# Patient Record
Sex: Female | Born: 1960 | Race: White | Hispanic: No | Marital: Married | Smoking: Never smoker
Health system: Southern US, Community
[De-identification: ages and names within clinical notes are randomized; demographics above are authoritative.]

## PROBLEM LIST (undated history)

## (undated) DIAGNOSIS — IMO0002 Reserved for concepts with insufficient information to code with codable children: Secondary | ICD-10-CM

## (undated) DIAGNOSIS — B977 Papillomavirus as the cause of diseases classified elsewhere: Secondary | ICD-10-CM

## (undated) HISTORY — DX: Reserved for concepts with insufficient information to code with codable children: IMO0002

## (undated) HISTORY — PX: WISDOM TOOTH EXTRACTION: SHX21

## (undated) HISTORY — DX: Papillomavirus as the cause of diseases classified elsewhere: B97.7

---

## 2003-11-22 ENCOUNTER — Other Ambulatory Visit: Admission: RE | Admit: 2003-11-22 | Discharge: 2003-11-22 | Payer: Self-pay | Admitting: *Deleted

## 2005-01-21 ENCOUNTER — Other Ambulatory Visit: Admission: RE | Admit: 2005-01-21 | Discharge: 2005-01-21 | Payer: Self-pay | Admitting: *Deleted

## 2005-10-12 ENCOUNTER — Emergency Department (HOSPITAL_COMMUNITY): Admission: EM | Admit: 2005-10-12 | Discharge: 2005-10-12 | Payer: Self-pay | Admitting: Emergency Medicine

## 2006-01-27 ENCOUNTER — Other Ambulatory Visit: Admission: RE | Admit: 2006-01-27 | Discharge: 2006-01-27 | Payer: Self-pay | Admitting: Obstetrics & Gynecology

## 2006-06-24 ENCOUNTER — Ambulatory Visit: Payer: Self-pay | Admitting: Psychiatry

## 2006-06-24 ENCOUNTER — Inpatient Hospital Stay (HOSPITAL_COMMUNITY): Admission: RE | Admit: 2006-06-24 | Discharge: 2006-06-26 | Payer: Self-pay | Admitting: Psychiatry

## 2006-06-24 ENCOUNTER — Emergency Department (HOSPITAL_COMMUNITY): Admission: EM | Admit: 2006-06-24 | Discharge: 2006-06-24 | Payer: Self-pay | Admitting: Emergency Medicine

## 2007-02-02 ENCOUNTER — Other Ambulatory Visit: Admission: RE | Admit: 2007-02-02 | Discharge: 2007-02-02 | Payer: Self-pay | Admitting: Obstetrics & Gynecology

## 2007-11-21 ENCOUNTER — Encounter: Admission: RE | Admit: 2007-11-21 | Discharge: 2007-11-21 | Payer: Self-pay | Admitting: Orthopedic Surgery

## 2009-10-11 IMAGING — CR DG CERVICAL SPINE 2 OR 3 VIEWS
2 series · 2 of 2 positions shown · non-contrast
Comparison: None.

CLINICAL DATA: Cervical pain and shoulder pain. 
 CERVICAL SPINE ? 2 VIEW:

[view not recorded (1 of 2)]
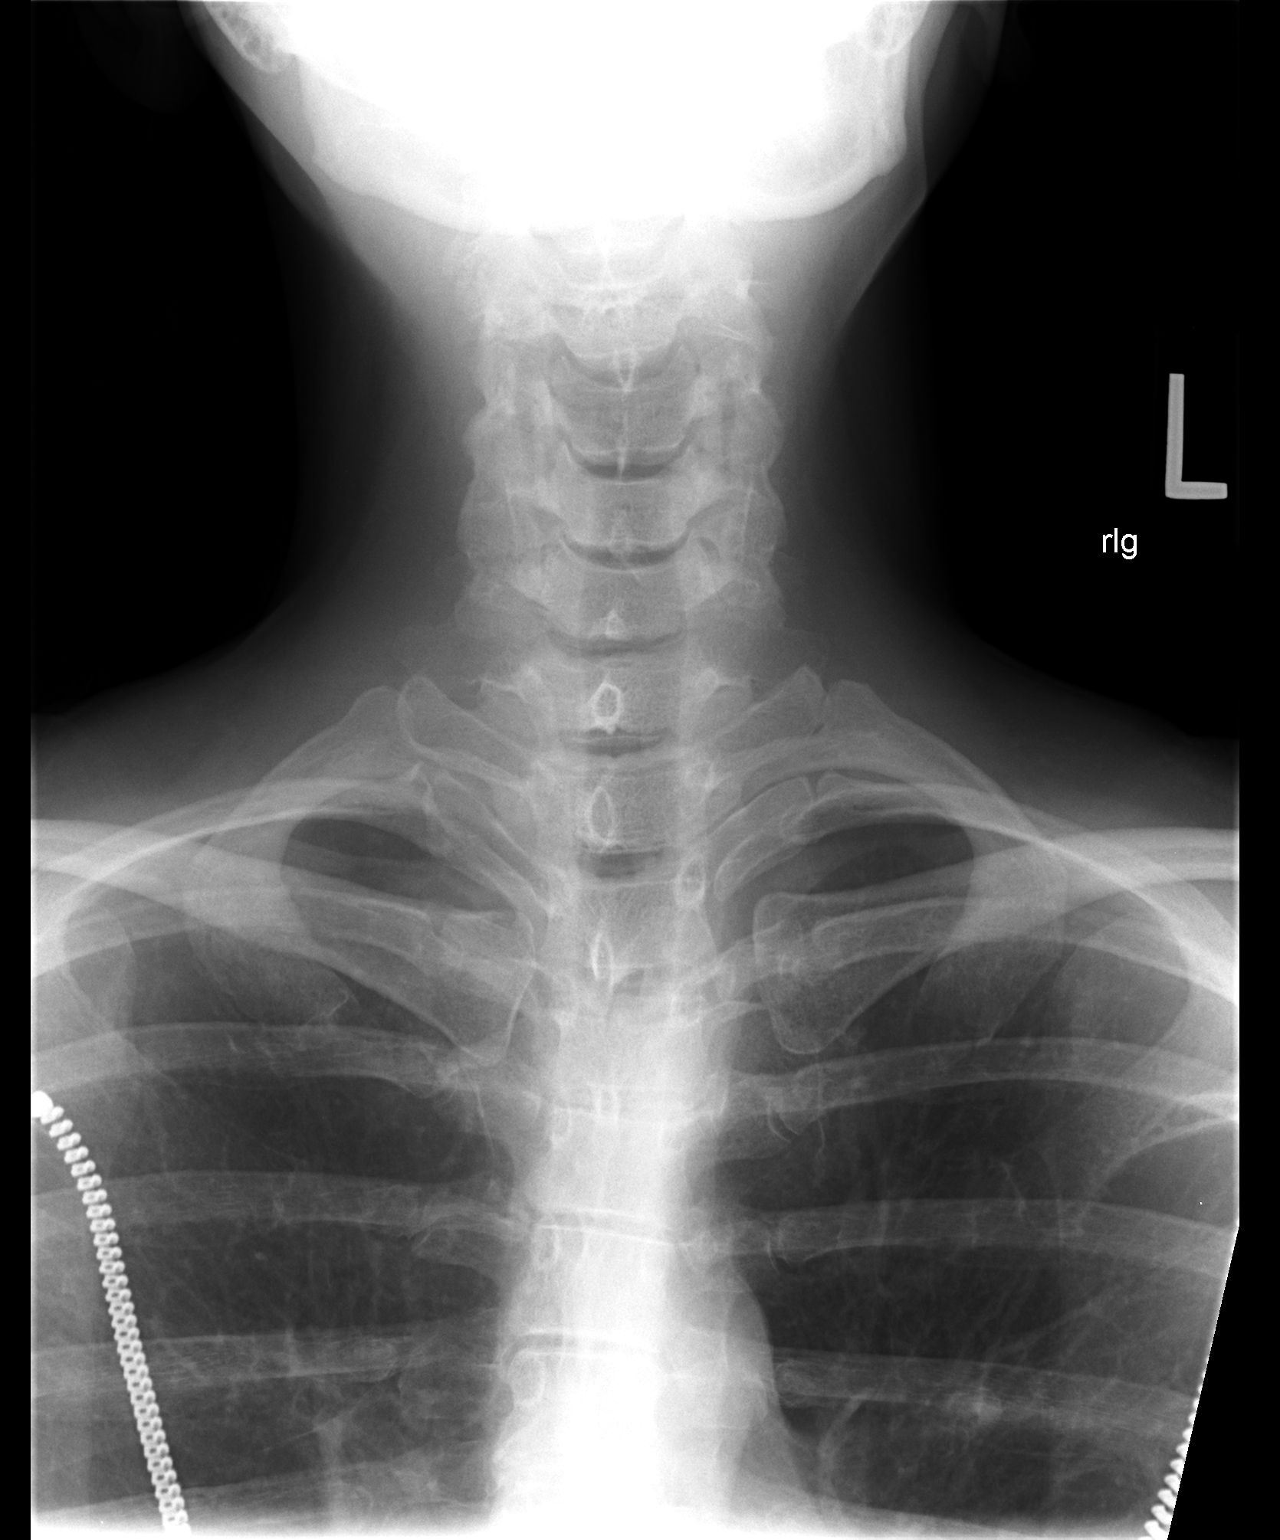

[view not recorded (2 of 2)]
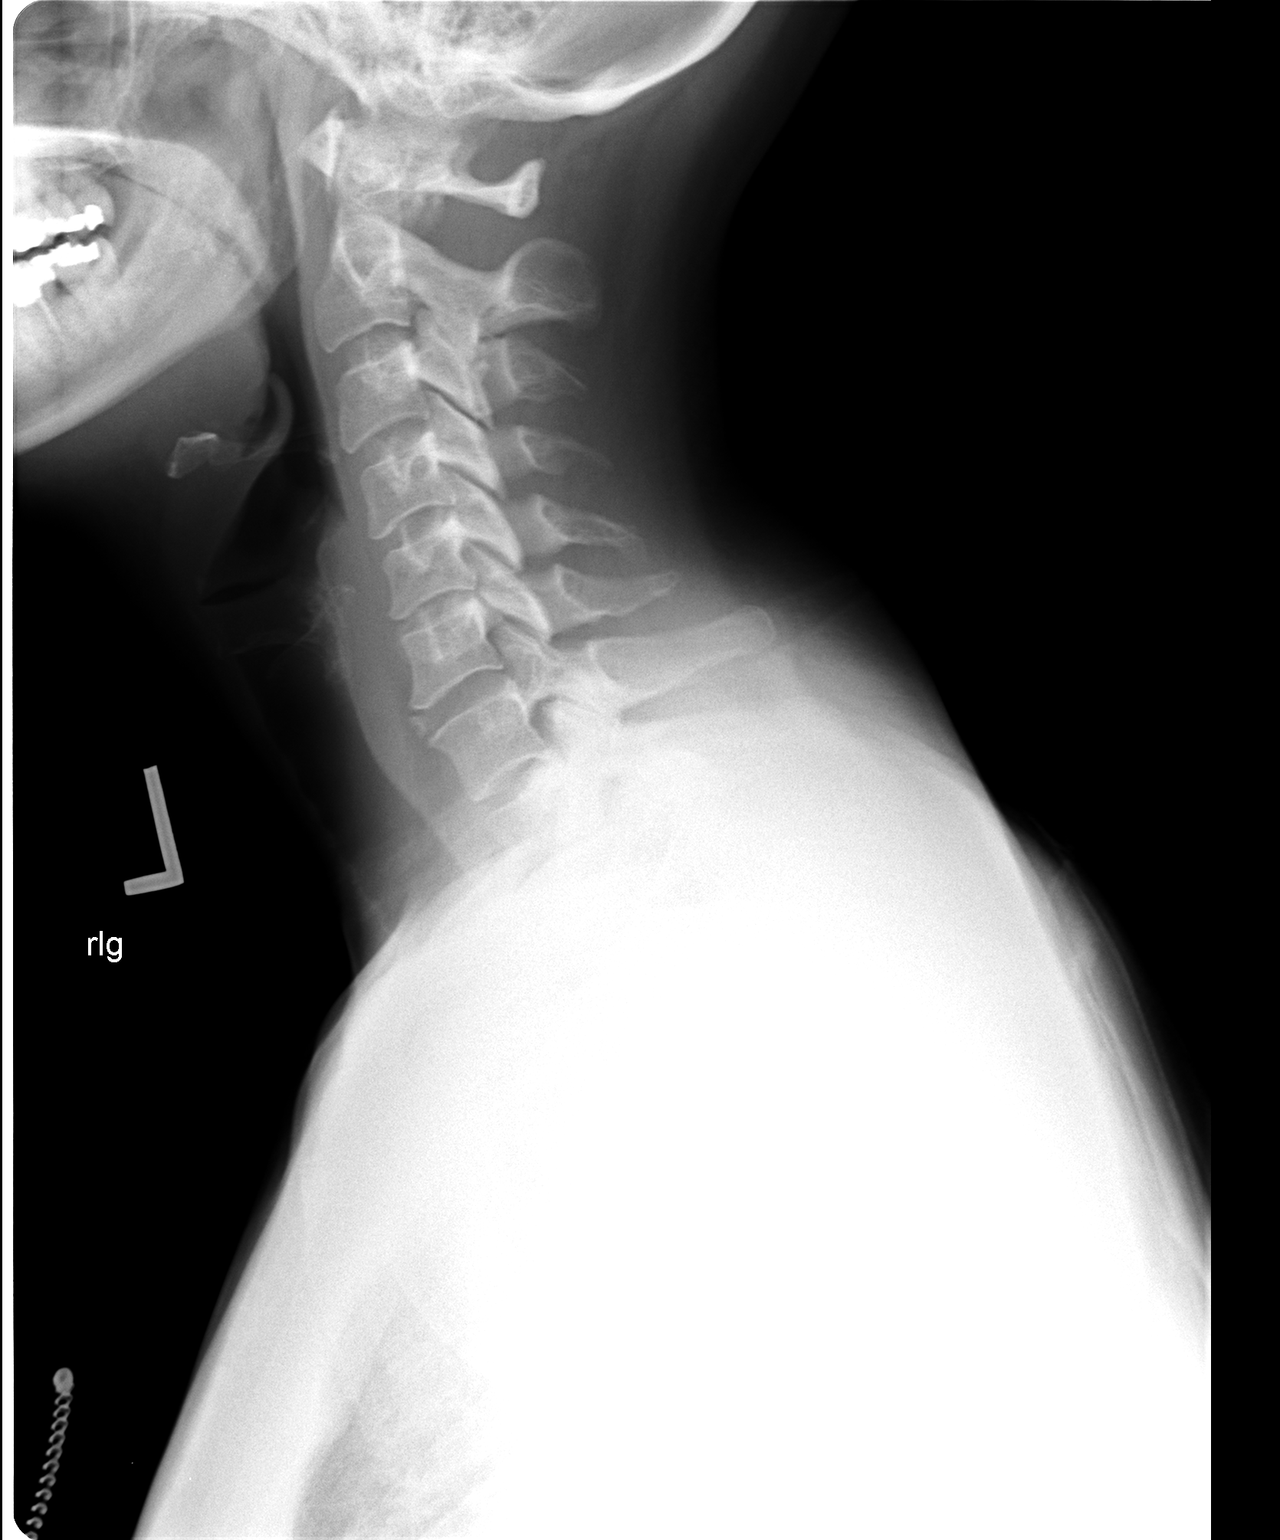

[2 of 2 positions shown; findings below may reference images not displayed]

FINDINGS: Cervical spine is visualized from the occiput to the C7-T1 junction.  Alignment is anatomic and prevertebral soft tissues are within normal limits.  Mild uncovertebral hypertrophy.  Anterior osteophyte at C5-6.  There may be minimal loss of disc space height at C5-6.
IMPRESSION: Mild spondylosis.

## 2011-01-23 NOTE — Discharge Summary (Signed)
Andrea Santos, Andrea Santos            ACCOUNT NO.:  [id number]   MEDICAL RECORD NO.:  [id number]          PATIENT TYPE:  IPS   LOCATION:  0303                          FACILITY:  [facility]   PHYSICIAN:  Syed T. Arfeen, M.D.   DATE OF BIRTH:  [birth year]   DATE OF ADMISSION:  06/25/2006  DATE OF DISCHARGE:  06/26/2006                               DISCHARGE SUMMARY   IDENTIFYING INFORMATION:  The patient is a 50 year old married female  who was admitted for depression and suicidal thoughts.  The patient  reported that she has been feeling down and sad for past six months.  She reported that she is overwhelmed and endorses marital conflict.  She  said that she just wanted to sleep with a bunch of Ambien.  She  reported that she is concerned about her husband, that he has been  spending a lot of money and has not come up with justified explanation.  She also reported that she is concerned if her husband has been using  drugs or involved in adultery.  The patient reported that she has lost  interest in daily activities and also endorsed sporadic sleep  disturbance.  She reported some poor appetite but denies any auditory  hallucinations, homicidal ideation or paranoid thoughts.  She said that  she wanted to get some therapy so that she can go back to live as a  normal person.  Please see admission note for more details.   PAST PSYCHIATRIC HISTORY:  The patient reported that she has been seeing  Dr. Wynonia Lawman a few years ago and given Ambien, which now maintained by  OB/GYN and she takes the Ambien whenever she goes into cycle, when she  cannot sleep.  The patient denies taking any other psychotropic  medication in the past.  No past psychiatric history of suicidal  attempt.   FAMILY HISTORY:  The patient told that her mother has a history of  depression and father has a history of alcohol abuse.   ALCOHOL/DRUG HISTORY:  The patient reported that sometimes she does  drink a bottle of wine and has  used marijuana in the past.  She endorsed  the last use of marijuana was a couple of days ago.   MEDICAL HISTORY:  The patient sees Dr. Clelia Croft in [place].  She denies  any history of heart disease, diabetes mellitus, seizure disorder or  bleeding disorder though she does report a migraine which she related to  her menstrual cycle.   MEDICATIONS:  She said that she takes __________ cream and Ambien 10 mg  as needed for sleep.   ALLERGIES:  No known drug allergies.   MENTAL STATUS EXAM:  The patient appears to be alert and oriented x3.  She is somewhat tearful but maintains good eye contact.  Cooperative and  pleasant on approach.  Her speech was somewhat normal rate, rhythm and  tone.  Her mood appears to be depressed and tearful at times and affect  was constricted.  She endorses some passive suicidal ideation that she  wanted to go to sleep for a long time.  She denies auditory  hallucinations, homicidal ideation or active suicidal thoughts.  Her  thought process is logical, linear and goal directed.  No paranoia,  obsession or delusion noted.  Her attention and concentration appears to  be fair.  Her cognition seems to be intact.  Insight, judgment and  impulse control good.   ADMISSION DIAGNOSES:  AXIS I:  Depressive disorder not otherwise  specified.  Rule out major depressive disorder, severe.  Rule out  alcohol/cannabis abuse versus dependence.  AXIS II:  Deferred.  AXIS III:  Migraines.  AXIS IV:  Marital conflict, problems with the primary support group.  AXIS V:  35-40.   HOSPITAL COURSE:  The patient was admitted and placed on a safety check.  The patient was recommended to be involved in group therapy.  She  refused to take any antidepressant and reported that she would like to  be discharged and she owed a 72-hour notice as she felt that she would  get more depressed if she stayed in the hospital.  Librium 25 mg was  offered as a p.r.n. for her anxiety though  patient continued to endorse  that she does not need any medication.  She stated that she would like  to get individual therapy and very reluctant to talk about her problems  in the group.  A family session with the husband was scheduled in which  husband did mention that she did not have any suicidal ideation or  homicidal ideation.  The patient also endorses that currently she has no  thoughts of hurting herself or anyone else and she wanted to get better  and that was a mistake what she did.  Husband was also a little  concerned that patient was not getting good care at the hospital and he  requested that the patient be discharged.  He endorses that outpatient  therapy would be more helpful for the patient.  On June 26, 2006, the  patient endorses that she is feeling much better and would like to go  back home.  She denies any suicidal thoughts, homicidal thoughts or  auditory hallucinations.  Her affect appears to be somewhat brighter.  The patient was discussed about the possible outpatient treatment  including intensive outpatient therapy.  The patient reported that she  would like to be followed up with her insurance network.  She feels that  she is ready to be discharged and can get help in individual as an  outpatient.  No psychiatric medication was started or given.   At the time of discharge, mental status appeared calm, cooperative,  pleasant, denies any suicidal thoughts, homicidal thoughts or auditory  hallucinations.  Affect bright with increased coping and social skills.   DISCHARGE MEDICATIONS:  None.   DISPOSITION:  The patient said that she wanted to get therapist and  psychiatric help in network.  The patient was recommended to call  casemanager for possible referrals which patient acknowledged and  agreed.   DISCHARGE DIAGNOSES:  AXIS I:  Depressive disorder not otherwise  specified.  AXIS II:  Deferred.  AXIS III:  Migraine. AXIS IV:  Marital conflicts.   AXIS V:  55-60.      Syed T. Lolly Mustache, M.D.  Electronically Signed     STA/MEDQ  D:  08/06/2006  T:  08/06/2006  Job:  [id number]

## 2012-04-08 DIAGNOSIS — R87619 Unspecified abnormal cytological findings in specimens from cervix uteri: Secondary | ICD-10-CM

## 2012-04-08 DIAGNOSIS — IMO0002 Reserved for concepts with insufficient information to code with codable children: Secondary | ICD-10-CM

## 2012-04-08 HISTORY — DX: Reserved for concepts with insufficient information to code with codable children: IMO0002

## 2012-04-08 HISTORY — DX: Unspecified abnormal cytological findings in specimens from cervix uteri: R87.619

## 2012-05-27 ENCOUNTER — Ambulatory Visit (INDEPENDENT_AMBULATORY_CARE_PROVIDER_SITE_OTHER): Payer: BC Managed Care – PPO | Admitting: Obstetrics and Gynecology

## 2012-05-27 ENCOUNTER — Encounter: Payer: Self-pay | Admitting: Obstetrics and Gynecology

## 2012-05-27 VITALS — BP 100/66 | Resp 16 | Ht 68.0 in | Wt 153.0 lb

## 2012-05-27 DIAGNOSIS — B977 Papillomavirus as the cause of diseases classified elsewhere: Secondary | ICD-10-CM

## 2012-05-27 DIAGNOSIS — N949 Unspecified condition associated with female genital organs and menstrual cycle: Secondary | ICD-10-CM

## 2012-05-27 HISTORY — DX: Papillomavirus as the cause of diseases classified elsewhere: B97.7

## 2012-05-27 NOTE — Progress Notes (Signed)
The patient is not currently taking hormone replacement therapy The patient  is not currently taking a Calcium supplement. Post-menopausal bleeding:yes:  Has spotting every once in a while per pt.  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  PT VERY UPSET PREVIOUS PRACTICE DISMISSED HER JUST BECAUSE SHE REQUESTED MORE INFO REGARDING HER PAP AND SHE WANTED THE PROVIDER TO COMPARE 2010 BLOOD WORK TO 2013 BLOOD WORK BUT THEY FAILED TO DO THAT FOR HER.  PT HAS RECORDS AND 10 YEARS WORTH OF ALL MEDS & SUPPLEMENTS SHE'S TAKEN IN ADDITION TO ALL OF HER PERIODS.      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 The patient was "discharged from her last practice" according to the patient because she refused to have a colposcopy until she had a better understanding of her condition with HPV 16. She had been very vigilant is her self care and had 10 years of records to bring to this visit O: All records  Reviewed.      2010 Pap : normal - no HPV      Aug 2013: HPV identified with no dysplasia on the Pap. Had been advised to have Colposcopy for mapping of Endocervical cells for dysplasia. The patient       Had minimal understanding of her condition and the procedure "which was not explained" according to the patient .      All lab results reviewed with the patient and the Genotyping of the HPV was explained and the etiology of HPV was explained and the nature of HPV 16/18.      The patient stated understanding that HPV 16/18 were a pre disposal to Carcinoma. She was relieved to understand the nature of her condition and the she       Now understands the procedure of Colposcopy. A: Patient has  diagnosis of HPV 16. The patient will need a  Colposcopy with Dr SR and f/u possibly with EP. P: Coloscopy to be organized for Ms Hitchcook with Dr  Lois Huxley schedule.  To call patient with appointment.     The patient was satisfied with her visit today. Earl Gala, CNM.

## 2012-06-06 ENCOUNTER — Telehealth: Payer: Self-pay | Admitting: Obstetrics and Gynecology

## 2012-06-08 NOTE — Telephone Encounter (Signed)
colpo sch for pt 07-01-12 per DD. Pt agreeable.  ld

## 2012-07-01 ENCOUNTER — Ambulatory Visit (INDEPENDENT_AMBULATORY_CARE_PROVIDER_SITE_OTHER): Payer: BC Managed Care – PPO | Admitting: Obstetrics and Gynecology

## 2012-07-01 ENCOUNTER — Encounter: Payer: Self-pay | Admitting: Obstetrics and Gynecology

## 2012-07-01 VITALS — BP 110/58 | Wt 155.0 lb

## 2012-07-01 DIAGNOSIS — Z0189 Encounter for other specified special examinations: Secondary | ICD-10-CM

## 2012-07-01 DIAGNOSIS — B977 Papillomavirus as the cause of diseases classified elsewhere: Secondary | ICD-10-CM

## 2012-07-01 DIAGNOSIS — R6889 Other general symptoms and signs: Secondary | ICD-10-CM

## 2012-07-01 DIAGNOSIS — IMO0002 Reserved for concepts with insufficient information to code with codable children: Secondary | ICD-10-CM

## 2012-07-01 LAB — POCT URINE PREGNANCY: Preg Test, Ur: NEGATIVE

## 2012-07-01 MED ORDER — FOLIC ACID 1 MG PO TABS: 1.0000 mg | ORAL_TABLET | Freq: Every day | ORAL | Status: AC

## 2012-07-01 NOTE — Progress Notes (Signed)
Colposcopy Procedure Note  Indications: Pap smear on August 2013 showed: Normal pap positive HPV . Previous colposcopy: pt never had one. Prior cervical treatment: no treatment.  Procedure Details  The risks and benefits of the procedure and Written informed consent obtained.  Speculum placed in vagina and excellent visualization of cervix achieved, cervix swabbed x 3 with acetic acid solution.  Findings: Cervix: no visible lesions; SCJ visualized 360 degrees without lesions. Vaginal inspection: normal without visible lesions. Vulvar colposcopy: normal mucosa without lesions.  Specimens: none  Complications: none.  Plan: Reviewed at length that Normal Pap with + HPV only requires that she maintains Pap yearly and not every 3 years. Answered all questions.  Silverio Lay MD

## 2012-10-27 ENCOUNTER — Telehealth: Payer: Self-pay | Admitting: Obstetrics and Gynecology

## 2012-10-27 NOTE — Telephone Encounter (Signed)
sr pt 

## 2012-10-27 NOTE — Telephone Encounter (Signed)
LVM for return call  Elgar Scoggins, CMA  

## 2012-10-27 NOTE — Telephone Encounter (Signed)
Pt calling at colposcopy visit she was given folvite. Pt states that she is concerned about rx not being organic. States that she is wanting to take only organic medication. Pt is wanting to know if this is something she needs to continue or if she can take a organic b or b complex. Wanting to know your opinion on this  Darien Ramus, CMA

## 2012-10-28 NOTE — Telephone Encounter (Signed)
She can take "organic" Folic Acid 1 mg daily until pap smear normalizes

## 2012-10-31 NOTE — Telephone Encounter (Signed)
Pt advised  Joseline Mccampbell, CMA  

## 2014-07-09 ENCOUNTER — Encounter: Payer: Self-pay | Admitting: Obstetrics and Gynecology

## 2017-02-15 ENCOUNTER — Ambulatory Visit (INDEPENDENT_AMBULATORY_CARE_PROVIDER_SITE_OTHER): Payer: BLUE CROSS/BLUE SHIELD | Admitting: Internal Medicine

## 2017-02-15 ENCOUNTER — Encounter: Payer: Self-pay | Admitting: Internal Medicine

## 2017-02-15 VITALS — BP 100/80 | HR 70 | Ht 68.5 in | Wt 151.0 lb

## 2017-02-15 DIAGNOSIS — R002 Palpitations: Secondary | ICD-10-CM | POA: Diagnosis not present

## 2017-02-15 NOTE — Progress Notes (Signed)
New Outpatient Visit Date: 02/15/2017  Referring Provider: Henreitta Leber, [facility] [address], [address] [place], [place] [address]  Chief Complaint: Palpitations  HPI:  Andrea Santos is a 56 y.o. female who is being seen today for the evaluation of palpitations at the request of Andrea Santos. She has no significant past medical history. Over the last 3 months, she has had multiple episodes of irregular heart beats and an increased awareness of her heart beating. She occasionally also feels a tightness in her throat. She describes the sensation in her chest like a hose that is being unkinked. She also notices occasional brief flutters. The episodes happen multiple times a day and can last anywhere from 10 minutes to several hours. The sensations seem to occur more frequently when she is hungry or tired. She has tried bringing them on by exercising without any noticeable change. She does not have any palpitations or lightheadedness with exercise. Andrea Santos reports having had palpitations about 8 years ago during a particularly stressful time in her life. She underwent exercise tolerance testing and reports that it was normal. She does not believe that she wore a heart monitor at the time. She began taking a vitamin and mineral supplement about a week ago and has noticed some improvement in the frequency and intensity of her palpitations. She has not had any chest pain, shortness of breath, lightheadedness, orthopnea, PND, and claudication. She does not consume any caffeine. She denies significant weight changes as well as changes to her hair and skin. Recent labs by Andrea Santos were unrevealing (see details below).  --------------------------------------------------------------------------------------------------  Cardiovascular History & Procedures: Cardiovascular Problems:  Palpitations  Risk Factors:  None  Cath/PCI:  None  CV Surgery:  None  EP Procedures  and Devices:  None  Non-Invasive Evaluation(s):  Patient reports exercise tolerance test about 8 years ago by an outside provider. She believes the test was negative.  Recent CV Pertinent Labs: See below.  --------------------------------------------------------------------------------------------------  Past Medical History:  Diagnosis Date  . Abnormal Pap smear 04/08/12   + hpv   . 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 CESAREAN SECTION    . WISDOM TOOTH EXTRACTION      Outpatient Encounter Prescriptions as of 02/15/2017  Medication Sig  . NON FORMULARY Takes a vitramin pack once a day that has a variety of meds  . [DISCONTINUED] zolpidem (AMBIEN) 5 MG tablet Take 5 mg by mouth at bedtime as needed.   No facility-administered encounter medications on file as of 02/15/2017.     Allergies: Patient has no known allergies.  Social History   Social History  . Marital status: Married    Spouse name: N/A  . Number of children: N/A  . Years of education: N/A   Occupational History  . Not on file.   Social History Main Topics  . Smoking status: Never Smoker  . Smokeless tobacco: Never Used  . Alcohol use 4.8 oz/week    2 Standard drinks or equivalent, 6 Glasses of wine per week  . Drug use: Yes    Frequency: 3.0 times per week    Types: Marijuana  . Sexual activity: Yes    Partners: Male    Birth control/ protection: None, Surgical     Comment: vas    Other Topics Concern  . Not on file   Social History Narrative  . No narrative on file  Family History  Problem Relation Age of Onset  . Thyroid nodules Paternal Grandmother   . Hypertension Paternal Grandmother   . Cancer Maternal Grandmother        ?  Marland Kitchen. COPD Father     Review of Systems: A 12-system review of systems was performed and was negative except as noted in the  HPI.  --------------------------------------------------------------------------------------------------  Physical Exam: BP 100/80   Pulse 70   Ht 5' 8.5" (1.74 m)   Wt 151 lb (68.5 kg)   LMP 03/10/2012   BMI 22.63 kg/m   General:  Well-developed, well-nourished woman seated comfortably in the exam room. HEENT: No conjunctival pallor or scleral icterus.  Moist mucous membranes.  OP clear. Neck: Supple without lymphadenopathy, thyromegaly, JVD, or HJR.  No carotid bruit. Lungs: Normal work of breathing.  Clear to auscultation bilaterally without wheezes or crackles. Heart: Regular rate and rhythm with rare extrasystoles. No murmurs, rubs, or gallops.  Non-displaced PMI. Abd: Bowel sounds present.  Soft, NT/ND without hepatosplenomegaly Ext: No lower extremity edema.  Radial, PT, and DP pulses are 2+ bilaterally Skin: warm and dry without rash Neuro: CNIII-XII intact.  Strength and fine-touch sensation intact in upper and lower extremities bilaterally. Psych: Normal mood and affect.  EKG:  Normal sinus rhythm with borderline left atrial enlargement. No significant abnormalities.  Outside labs (01/28/17):  Lipid panel: Total cholesterol 192, HDL 84, triglyceride 155, LDL 82  CMP: Sodium 140, potassium 4.1, chloride 109, CO2 19, BUN 11, creatinine 0.97, glucose 104, calcium 9.9, AST, 18, ALT 8, alkaline phosphatase 61, total bilirubin 0.4, total protein 7.4, albumin 4.5  CBC: WBC 6.0, HGB 14.1, HCT 41.4%, platelet 200  TSH 1.56 --------------------------------------------------------------------------------------------------  ASSESSMENT AND PLAN: Palpitations Symptoms have been present on a daily basis for the last 3 months. EKG today is unrevealing, though rare extrasystoles were noted on exam, suggestive of PACs or PVCs. There are no worrisome symptoms accompanying her palpitations. She is able to exercise without symptoms. We have agreed to obtain a 48-hour Holter monitor for  further characterization. Recent labs were unrevealing, including normal potassium and TSH. We have agreed to defer checking a magnesium level, as the patient routinely takes a magnesium supplement and does not have other electrolyte abnormalities. Patient encouraged to limit alcohol and marijuana use.  Follow-up: Return to clinic in 1 month.  Yvonne Kendallhristopher Walther San[place]ustin, MD 02/16/2017 10:00 AM

## 2017-02-15 NOTE — Patient Instructions (Signed)
Medication Instructions:  Your physician recommends that you continue on your current medications as directed. Please refer to the Current Medication list given to you today.  Labwork: None   Testing/Procedures: Your physician has recommended that you wear a holter monitor. Holter monitors are medical devices that record the heart's electrical activity. Doctors most often use these monitors to diagnose arrhythmias. Arrhythmias are problems with the speed or rhythm of the heartbeat. The monitor is a small, portable device. You can wear one while you do your normal daily activities. This is usually used to diagnose what is causing palpitations/syncope (passing out).  48 HOUR  Follow-Up: Your physician recommends that you schedule a follow-up appointment in: 1 month with Dr End.        If you need a refill on your cardiac medications before your next appointment, please call your pharmacy.

## 2017-02-16 ENCOUNTER — Encounter: Payer: Self-pay | Admitting: Internal Medicine

## 2017-03-26 ENCOUNTER — Ambulatory Visit: Payer: BLUE CROSS/BLUE SHIELD | Admitting: Internal Medicine

## 2017-04-02 ENCOUNTER — Ambulatory Visit (INDEPENDENT_AMBULATORY_CARE_PROVIDER_SITE_OTHER): Payer: BLUE CROSS/BLUE SHIELD

## 2017-04-02 DIAGNOSIS — R002 Palpitations: Secondary | ICD-10-CM | POA: Diagnosis not present

## 2017-04-05 ENCOUNTER — Telehealth: Payer: Self-pay | Admitting: Internal Medicine

## 2017-04-05 NOTE — Telephone Encounter (Signed)
Patient calling, states that she received Holter Monitor on Friday and was supposed to have it monitor for 48 hours, but patient states that monitor cut off after 24 hours. Please call, patient is not sure what she should do.

## 2017-04-08 ENCOUNTER — Telehealth: Payer: Self-pay | Admitting: *Deleted

## 2017-04-08 NOTE — Telephone Encounter (Signed)
Took call from [facility], in Triage. Per [facility], pt has called on and off all day and has tried to find out if she needed to continue to wear her heart monitor since she already wore 1 for 24 hours and it died on her.  I recommended to pt to just wear it for the complete 48 hours, that the more information we gathered, the better it was for a diagnosis.  Pt very frustrated that she has waited all day to get an answer.  She stated that they had [facility] and noone has yet called her back. I apologized to pt for the inconvenience this has caused, she stated to me, " you should be", "even though It's not your fault, and I'm sorry I'm taking it out on you, but I am very frustrated.   Pt asked me to pass this along to mgmt. She doesn't feel that she got professional medical service.  Routed message to mgmt and [facility].

## 2017-04-08 NOTE — Telephone Encounter (Signed)
Follow up     Pt is calling back to follow up on holter monitor. Please call.

## 2017-04-08 NOTE — Telephone Encounter (Signed)
F/U call:  Patient states that she has some questions about her Holter Monitor and when she can take it off. Please call.

## 2017-04-09 ENCOUNTER — Telehealth: Payer: Self-pay | Admitting: *Deleted

## 2017-04-09 DIAGNOSIS — I491 Atrial premature depolarization: Secondary | ICD-10-CM

## 2017-04-09 NOTE — Telephone Encounter (Signed)
Patient was enrolled for a 48 hour holter monitor with [employer].  [employer] had supplied the [employer] office with monitors, which, were retreived from [employer], during a holter monitor shortage, ([employer] system was hacked and shut down).  The monitor that was applied to patient stopped recording after 24 hours.  Patient had made multiple documentation of symptoms during that 24 hour period.  Patient had discussed this with me. I had patient come in to apply a 2nd monitor for 24 hours.  The first  monitor was uploaded and 24 hours of data was confirmed.  The monitor representative let me know the monitor we were given was a new monitor with the old wires, which, would only record to 24 hours.  Patient was called and given this information.  I am awaiting the results from the second monitor.  The first 24 hour holter and 2nd 24 hour holter will be imported under the initial order.  The patient will be charged by [employer] for one monitor.  There will be on interpretation charge.  Patient has also been made aware.  Patient did not seem upset when I spoke with her again this morning.

## 2017-04-20 NOTE — Telephone Encounter (Signed)
I spoke with Andrea Santos regarding the results of her monitor, which showed occasional PACs correlating to her symptoms. We discussed medical therapy, including initiation of a low-dose beta blocker. Given that she is not particularly bothered by her PACs, she declines therapy at this time. We have agreed to obtain a transthoracic echocardiogram to exclude structural abnormalities. She would like information regarding her insurance coverage/co-pay prior to proceeding with the test. We will follow-up as needed, based on the results of the echo.  Yvonne Kendallhristopher Maureen Delatte, MD [facility] Pager: [phone]

## 2017-04-22 NOTE — Telephone Encounter (Signed)
LMTCB

## 2017-04-23 NOTE — Telephone Encounter (Signed)
Echocardiogram is scheduled for 05/13/17, I have sent message to prior authorization asking them to contact her to see if they can help with insurance/billing.

## 2017-04-23 NOTE — Telephone Encounter (Signed)
I spoke with patient.

## 2017-04-23 NOTE — Telephone Encounter (Signed)
Se phone note 04/09/17

## 2017-04-23 NOTE — Telephone Encounter (Signed)
F/u message ° °Pt returning call. Please call back to discuss  °

## 2017-05-13 ENCOUNTER — Encounter (INDEPENDENT_AMBULATORY_CARE_PROVIDER_SITE_OTHER): Payer: Self-pay

## 2017-05-13 ENCOUNTER — Other Ambulatory Visit: Payer: Self-pay

## 2017-05-13 ENCOUNTER — Ambulatory Visit (HOSPITAL_COMMUNITY): Payer: BLUE CROSS/BLUE SHIELD | Attending: Cardiovascular Disease

## 2017-05-13 DIAGNOSIS — R002 Palpitations: Secondary | ICD-10-CM | POA: Insufficient documentation

## 2017-05-13 DIAGNOSIS — I491 Atrial premature depolarization: Secondary | ICD-10-CM | POA: Diagnosis not present

## 2017-05-13 DIAGNOSIS — R0602 Shortness of breath: Secondary | ICD-10-CM | POA: Diagnosis not present

## 2023-04-08 DIAGNOSIS — M79642 Pain in left hand: Secondary | ICD-10-CM | POA: Diagnosis not present

## 2023-04-08 DIAGNOSIS — M79641 Pain in right hand: Secondary | ICD-10-CM | POA: Diagnosis not present

## 2024-03-28 DIAGNOSIS — R87810 Cervical high risk human papillomavirus (HPV) DNA test positive: Secondary | ICD-10-CM | POA: Diagnosis not present

## 2024-03-28 DIAGNOSIS — N390 Urinary tract infection, site not specified: Secondary | ICD-10-CM | POA: Diagnosis not present

## 2024-03-28 DIAGNOSIS — Z1231 Encounter for screening mammogram for malignant neoplasm of breast: Secondary | ICD-10-CM | POA: Diagnosis not present

## 2024-03-28 DIAGNOSIS — Z6823 Body mass index (BMI) 23.0-23.9, adult: Secondary | ICD-10-CM | POA: Diagnosis not present

## 2024-03-28 DIAGNOSIS — Z01419 Encounter for gynecological examination (general) (routine) without abnormal findings: Secondary | ICD-10-CM | POA: Diagnosis not present

## 2024-03-28 DIAGNOSIS — R87610 Atypical squamous cells of undetermined significance on cytologic smear of cervix (ASC-US): Secondary | ICD-10-CM | POA: Diagnosis not present

## 2024-03-28 DIAGNOSIS — Z1211 Encounter for screening for malignant neoplasm of colon: Secondary | ICD-10-CM | POA: Diagnosis not present

## 2024-03-28 DIAGNOSIS — N393 Stress incontinence (female) (male): Secondary | ICD-10-CM | POA: Diagnosis not present

## 2024-03-29 DIAGNOSIS — R8290 Unspecified abnormal findings in urine: Secondary | ICD-10-CM | POA: Diagnosis not present

## 2024-04-19 DIAGNOSIS — R87810 Cervical high risk human papillomavirus (HPV) DNA test positive: Secondary | ICD-10-CM | POA: Diagnosis not present
